# Patient Record
Sex: Female | Born: 1985 | Race: Black or African American | Hispanic: No | Marital: Single | Smoking: Never smoker
Health system: Southern US, Community
[De-identification: ages and names within clinical notes are randomized; demographics above are authoritative.]

## PROBLEM LIST (undated history)

## (undated) DIAGNOSIS — T7840XA Allergy, unspecified, initial encounter: Secondary | ICD-10-CM

## (undated) DIAGNOSIS — D649 Anemia, unspecified: Secondary | ICD-10-CM

## (undated) HISTORY — DX: Allergy, unspecified, initial encounter: T78.40XA

## (undated) HISTORY — DX: Anemia, unspecified: D64.9

---

## 2012-10-21 ENCOUNTER — Encounter (HOSPITAL_COMMUNITY): Payer: Self-pay | Admitting: Emergency Medicine

## 2012-10-21 ENCOUNTER — Emergency Department (HOSPITAL_COMMUNITY): Payer: Self-pay

## 2012-10-21 ENCOUNTER — Emergency Department (HOSPITAL_COMMUNITY)
Admission: EM | Admit: 2012-10-21 | Discharge: 2012-10-21 | Disposition: A | Discharge status: Home / Self Care | Payer: Self-pay | Attending: Emergency Medicine | Admitting: Emergency Medicine

## 2012-10-21 DIAGNOSIS — M7918 Myalgia, other site: Secondary | ICD-10-CM

## 2012-10-21 DIAGNOSIS — IMO0001 Reserved for inherently not codable concepts without codable children: Secondary | ICD-10-CM | POA: Insufficient documentation

## 2012-10-21 DIAGNOSIS — Z79899 Other long term (current) drug therapy: Secondary | ICD-10-CM | POA: Insufficient documentation

## 2012-10-21 MED ORDER — CYCLOBENZAPRINE HCL 10 MG PO TABS: 10.0000 mg | ORAL_TABLET | Freq: Two times a day (BID) | ORAL | Status: DC | PRN

## 2012-10-21 MED ORDER — KETOROLAC TROMETHAMINE 60 MG/2ML IM SOLN
60.0000 mg | Freq: Once | INTRAMUSCULAR | Status: DC
  Filled 2012-10-21: qty 2

## 2012-10-21 MED ORDER — IBUPROFEN 800 MG PO TABS: 800.0000 mg | ORAL_TABLET | Freq: Three times a day (TID) | ORAL | Status: DC

## 2012-10-21 MED ORDER — DIAZEPAM 5 MG PO TABS
5.0000 mg | ORAL_TABLET | Freq: Once | ORAL | Status: AC
  Administered 2012-10-21: 5 mg via ORAL
  Filled 2012-10-21: qty 1

## 2012-10-21 NOTE — ED Provider Notes (Signed)
History     CSN: [id number]  Arrival date & time 10/21/12  2125   First MD Initiated Contact with Patient 10/21/12 2157      Chief Complaint  Patient presents with  . Back Pain    R sided rib pain    (Consider location/radiation/quality/duration/timing/severity/associated sxs/prior treatment) HPI Comments: Patient is a 27 year old female who presents with sudden onset of middle back pain that started earlier this evening. The patient believes the pain is coming from a knot on her back. The pain is sharp and severe and does not radiate. The pain is constant. Movement and inspiration makes the pain worse. Nothing makes the pain better. Patient has not tried anything for pain. No associated symptoms. No saddles paresthesias or bladder/bowel incontinence.      History reviewed. No pertinent past medical history.  History reviewed. No pertinent past surgical history.  No family history on file.  History  Substance Use Topics  . Smoking status: Never Smoker   . Smokeless tobacco: Not on file  . Alcohol Use: Yes    OB History   Grav Para Term Preterm Abortions TAB SAB Ect Mult Living                  Review of Systems  Musculoskeletal: Positive for back pain.  All other systems reviewed and are negative.    Allergies  Review of patient's allergies indicates no known allergies.  Home Medications   Current Outpatient Rx  Name  Route  Sig  Dispense  Refill  . DM-APAP-CPM (CORICIDIN HBP FLU PO)   Oral   Take 2 tablets by mouth every 6 (six) hours as needed. For flu symptoms         . ibuprofen (ADVIL,MOTRIN) 200 MG tablet   Oral   Take 400 mg by mouth daily as needed for pain. For pain           BP 115/70  Pulse 83  Temp(Src) 98.1 F (36.7 C) (Oral)  Resp 16  SpO2 100%  LMP 10/18/2012  Physical Exam  Nursing note and vitals reviewed. Constitutional: She is oriented to person, place, and time. She appears well-developed and well-nourished. No distress.   HENT:  Head: Normocephalic and atraumatic.  Eyes: Conjunctivae are normal.  Neck: Normal range of motion. Neck supple.  Cardiovascular: Normal rate and regular rhythm.  Exam reveals no gallop and no friction rub.   No murmur heard. Pulmonary/Chest: Effort normal and breath sounds normal. She has no wheezes. She has no rales. She exhibits no tenderness.  Abdominal: Soft. There is no tenderness.  Musculoskeletal: Normal range of motion.  No right mid-back tenderness to palpation. No bruising or obvious deformity.   Neurological: She is alert and oriented to person, place, and time.  Speech is goal-oriented. Moves limbs without ataxia.   Skin: Skin is warm and dry.  Psychiatric: She has a normal mood and affect. Her behavior is normal.    ED Course  Procedures (including critical care time)  Labs Reviewed - No data to display Dg Chest 2 View  10/21/2012  *RADIOLOGY REPORT*  Clinical Data: Lambert Mody right side rib pain with inspiration, back pain, recent flu  CHEST - 2 VIEW  Comparison: None  Findings: Normal heart size, mediastinal contours, and pulmonary vascularity. Lungs grossly clear. No pleural effusion or pneumothorax. Bones unremarkable.  IMPRESSION: No acute abnormalities.   Original Report Authenticated By: Ulyses Southward, M.D.      1. Musculoskeletal pain  MDM  11:04 PM Patient reports relief after valium. Patient is PERC negative. Patient denies fever an urinary symptoms. Likely having a musculoskeletal pain. Patient will be discharged with pain medication and instructions to apply heat. Patient instructed to return with worsening or concerning symptoms.         Emilia Beck, PA-C 10/28/12 0246

## 2012-10-21 NOTE — ED Notes (Signed)
Pt states it felt like a knot on the center of her back this evening.  States she did stretches to help with knot and then 30 min later started having pain to R ribs with deep inspiration.

## 2012-10-28 NOTE — ED Provider Notes (Signed)
Medical screening examination/treatment/procedure(s) were performed by non-physician practitioner and as supervising physician I was immediately available for consultation/collaboration.  Corderius Saraceni, MD 10/28/12 0754 

## 2013-02-06 ENCOUNTER — Ambulatory Visit: Payer: BC Managed Care – PPO | Admitting: Internal Medicine

## 2014-01-15 IMAGING — CR DG CHEST 2V
2 series · 2 of 2 positions shown · non-contrast
Comparison: None

CLINICAL DATA: Sharp right side rib pain with inspiration, back
pain, recent flu

CHEST - 2 VIEW

[w chest pa]
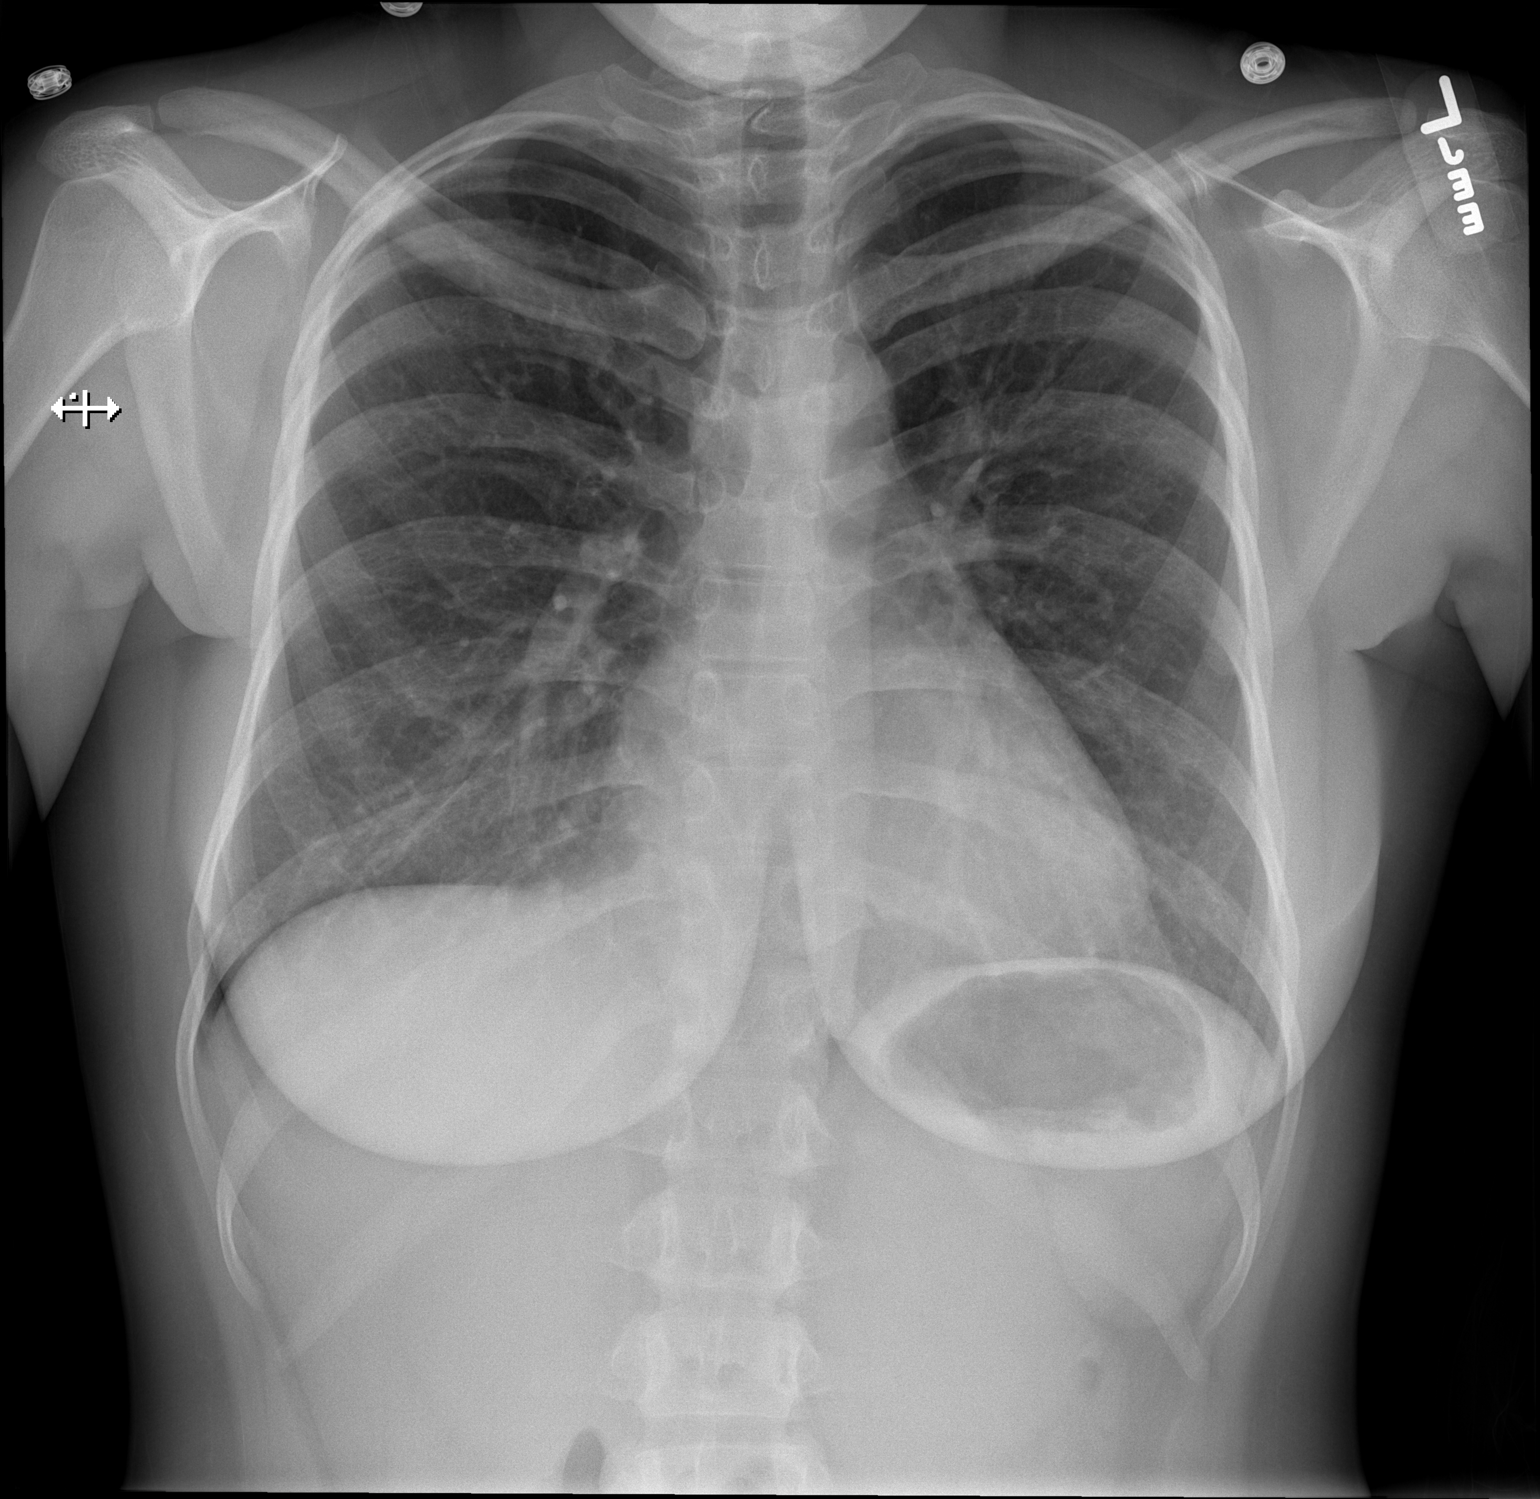

[w chest lat]
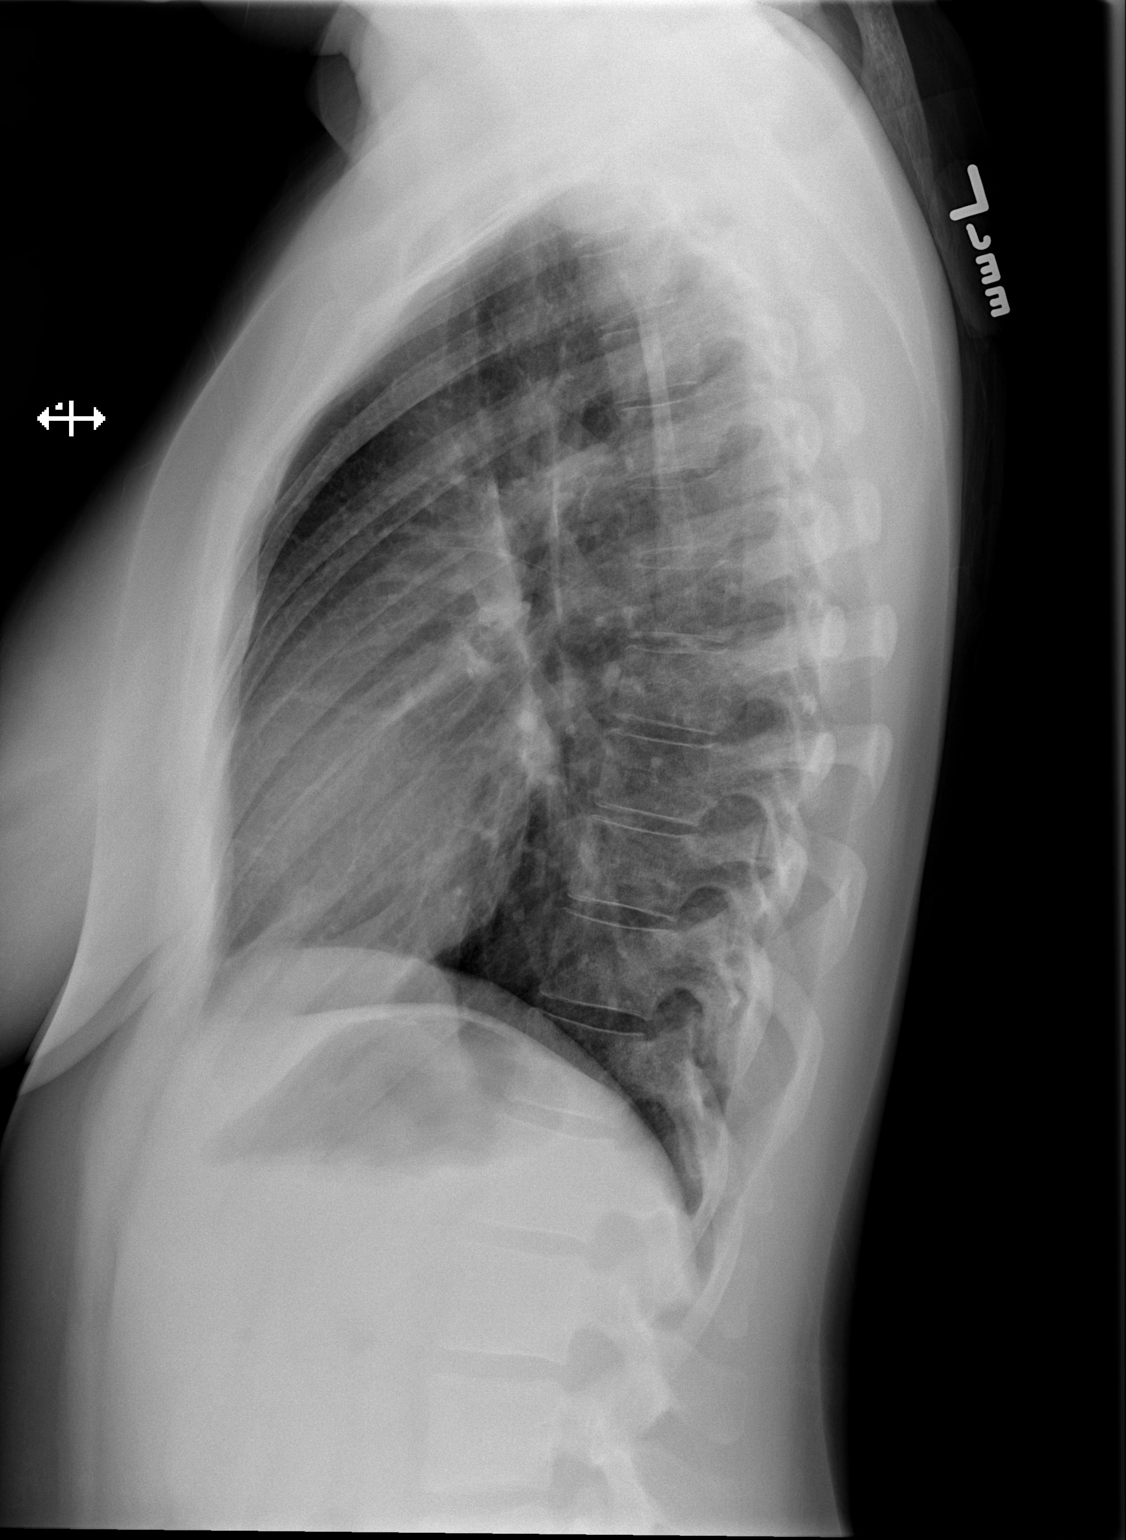

[2 of 2 positions shown; findings below may reference images not displayed]

FINDINGS: Normal heart size, mediastinal contours, and pulmonary vascularity.
Lungs grossly clear.
No pleural effusion or pneumothorax.
Bones unremarkable.
IMPRESSION: No acute abnormalities.

## 2014-11-24 ENCOUNTER — Emergency Department (HOSPITAL_COMMUNITY)
Admission: EM | Admit: 2014-11-24 | Discharge: 2014-11-24 | Disposition: A | Discharge status: Home / Self Care | Payer: 59 | Attending: Emergency Medicine | Admitting: Emergency Medicine

## 2014-11-24 ENCOUNTER — Encounter (HOSPITAL_COMMUNITY): Payer: Self-pay | Admitting: Emergency Medicine

## 2014-11-24 DIAGNOSIS — Z791 Long term (current) use of non-steroidal anti-inflammatories (NSAID): Secondary | ICD-10-CM | POA: Insufficient documentation

## 2014-11-24 DIAGNOSIS — L299 Pruritus, unspecified: Secondary | ICD-10-CM | POA: Insufficient documentation

## 2014-11-24 DIAGNOSIS — R202 Paresthesia of skin: Secondary | ICD-10-CM | POA: Diagnosis present

## 2014-11-24 LAB — BASIC METABOLIC PANEL
ANION GAP: 9 (ref 5–15)
BUN: 14 mg/dL (ref 6–23)
CALCIUM: 9.2 mg/dL (ref 8.4–10.5)
CO2: 24 mmol/L (ref 19–32)
CREATININE: 0.83 mg/dL (ref 0.50–1.10)
Chloride: 104 mmol/L (ref 96–112)
GFR calc non Af Amer: >90 mL/min (ref >90)
Glucose, Bld: 93 mg/dL (ref 70–99)
Potassium: 4.1 mmol/L (ref 3.5–5.1)
Sodium: 137 mmol/L (ref 135–145)

## 2014-11-24 LAB — CBC WITH DIFFERENTIAL/PLATELET
BASOS ABS: 0.1 10*3/uL (ref 0.0–0.1)
Basophils Relative: 1 % (ref 0–1)
Eosinophils Absolute: 0.4 10*3/uL (ref 0.0–0.7)
Eosinophils Relative: 4 % (ref 0–5)
HCT: 39.2 % (ref 36.0–46.0)
Hemoglobin: 12.6 g/dL (ref 12.0–15.0)
LYMPHS ABS: 1.7 10*3/uL (ref 0.7–4.0)
LYMPHS PCT: 17 % (ref 12–46)
MCH: 26.7 pg (ref 26.0–34.0)
MCHC: 32.1 g/dL (ref 30.0–36.0)
MCV: 83.1 fL (ref 78.0–100.0)
Monocytes Absolute: 0.6 10*3/uL (ref 0.1–1.0)
Monocytes Relative: 6 % (ref 3–12)
NEUTROS ABS: 7.1 10*3/uL (ref 1.7–7.7)
NEUTROS PCT: 72 % (ref 43–77)
PLATELETS: 282 10*3/uL (ref 150–400)
RBC: 4.72 MIL/uL (ref 3.87–5.11)
RDW: 12.8 % (ref 11.5–15.5)
WBC: 9.9 10*3/uL (ref 4.0–10.5)

## 2014-11-24 MED ORDER — HYDROXYZINE HCL 25 MG PO TABS: 25.0000 mg | ORAL_TABLET | Freq: Three times a day (TID) | ORAL | Status: DC | PRN

## 2014-11-24 NOTE — ED Provider Notes (Signed)
CSN: [id number]     Arrival date & time 11/24/14  1519 History  This chart was scribed for non-physician practitioner, Fayrene Helper, PA-C,working with Azalia Bilis, MD, by Karle Plumber, ED Scribe. This patient was seen in room WTR6/WTR6 and the patient's care was started at 3:43 PM.  Chief Complaint  Patient presents with  . tingling all over    The history is provided by the patient and medical records. No language interpreter was used.    HPI Comments:  Wanda Hendrix is a 29 y.o. female with PMHx of anemia who presents to the Emergency Department complaining of a crawling, itching feeling throughout her body for the past 7 days. She reports she feels intermittent "zaps" she describes as a bug biting her. She states she has never had symptoms like this in the past and reports it is difficult for her to concentrate. She reports increase in stress level for the past few months stating she is taking the [employer] in the next week. She denies any new soaps, creams, detergents or any other personal hygiene products. She denies any modifying factors of her symptoms or contact with anyone with similar symptoms. She reports her LMP was one week ago, lasted 5 days, and was normal. She denies taking any herbal supplements and reports the only medications she takes are iron supplements. Denies any illicit drug use. Denies difficulty breathing, difficulty swallowing, fever, chills, light-headedness, dizziness, abdominal cramping, nausea or vomiting.  History reviewed. No pertinent past medical history. History reviewed. No pertinent past surgical history. No family history on file. History  Substance Use Topics  . Smoking status: Never Smoker   . Smokeless tobacco: Not on file  . Alcohol Use: Yes   OB History    No data available     Review of Systems  Constitutional: Negative for fever and chills.  HENT: Negative for trouble swallowing.   Respiratory: Negative for shortness of breath.    Gastrointestinal: Negative for nausea, vomiting and abdominal pain.  Skin: Negative for rash.       Itching  Neurological: Negative for dizziness and light-headedness.    Allergies  Review of patient's allergies indicates no known allergies.  Home Medications   Prior to Admission medications   Medication Sig Start Date End Date Taking? Authorizing Provider  cyclobenzaprine (FLEXERIL) 10 MG tablet Take 1 tablet (10 mg total) by mouth 2 (two) times daily as needed for muscle spasms. 10/21/12   Kaitlyn Szekalski, PA-C  DM-APAP-CPM (CORICIDIN HBP FLU PO) Take 2 tablets by mouth every 6 (six) hours as needed. For flu symptoms    Historical Provider, MD  ibuprofen (ADVIL,MOTRIN) 200 MG tablet Take 400 mg by mouth daily as needed for pain. For pain    Historical Provider, MD  ibuprofen (ADVIL,MOTRIN) 800 MG tablet Take 1 tablet (800 mg total) by mouth 3 (three) times daily. 10/21/12   Emilia Beck, PA-C   Triage Vitals: BP 116/76 mmHg  Pulse 91  Resp 14  SpO2 99%  LMP 11/17/2014 Physical Exam  Constitutional: She is oriented to person, place, and time. She appears well-developed and well-nourished.  HENT:  Head: Normocephalic and atraumatic.  Mouth/Throat: Uvula is midline, oropharynx is clear and moist and mucous membranes are normal.  Eyes: EOM are normal.  Mildly pale conjunctiva.  Neck: Normal range of motion.  Cardiovascular: Normal rate, regular rhythm and normal heart sounds.  Exam reveals no gallop and no friction rub.   No murmur heard. Pulmonary/Chest: Effort normal and breath sounds  normal. No respiratory distress. She has no wheezes. She has no rales.  Abdominal: Soft. There is no tenderness.  Musculoskeletal: Normal range of motion.  Neurological: She is alert and oriented to person, place, and time.  Skin: Skin is warm and dry. No rash noted.  Psychiatric: She has a normal mood and affect. Her behavior is normal.  Nursing note and vitals reviewed.   ED Course   Procedures (including critical care time) DIAGNOSTIC STUDIES: Oxygen Saturation is 99% on RA, normal by my interpretation.   COORDINATION OF CARE: 3:51 PM- Will check labs. Pt verbalizes understanding and agrees to plan.  4:12 PM Pt with generalized pruritus without specific cause.  Stress is a possible contributor.  Will check basic labs to r/o electrolytes abnormalities or other cause such as polycythemia vera or iron deficiency pruritus. Vital sign otherwise stable.    5:59 PM No evidence of iron deficiency anemia or polycythemia vera.  Normal electrolytes panel.  Stable for discharge.  reeturn precaution discussed.   Medications - No data to display  Labs Review Labs Reviewed  CBC WITH DIFFERENTIAL/PLATELET  BASIC METABOLIC PANEL    Imaging Review No results found.   EKG Interpretation None      MDM   Final diagnoses:  Pruritus and related conditions    BP 116/76 mmHg  Pulse 91  Temp(Src) 98.4 F (36.9 C) (Oral)  Resp 14  SpO2 99%  LMP 11/17/2014   I personally performed the services described in this documentation, which was scribed in my presence. The recorded information has been reviewed and is accurate.    Fayrene HelperBowie Devany Aja, PA-C 11/24/14 1836  Azalia BilisKevin Campos, MD 11/24/14 [phone]

## 2014-11-24 NOTE — ED Notes (Signed)
Per pt, states she has tingling all over-unable to concentrate-states it feels like a when a bug bites your skin

## 2014-11-24 NOTE — Discharge Instructions (Signed)
Pruritus  °Pruritus is an itch. There are many different problems that can cause an itch. Dry skin is one of the most common causes of itching. Most cases of itching do not require medical attention.  °HOME CARE INSTRUCTIONS  °Make sure your skin is moistened on a regular basis. A moisturizer that contains petroleum jelly is best for keeping moisture in your skin. If you develop a rash, you may try the following for relief:  °· Use corticosteroid cream. °· Apply cool compresses to the affected areas. °· Bathe with Epsom salts or baking soda in the bathwater. °· Soak in colloidal oatmeal baths. These are available at your pharmacy. °· Apply baking soda paste to the rash. Stir water into baking soda until it reaches a paste-like consistency. °· Use an anti-itch lotion. °· Take over-the-counter diphenhydramine medicine by mouth as the instructions direct. °· Avoid scratching. Scratching may cause the rash to become infected. If itching is very bad, your caregiver may suggest prescription lotions or creams to lessen your symptoms. °· Avoid hot showers, which can make itching worse. A cold shower may help with itching as long as you use a moisturizer after the shower. °SEEK MEDICAL CARE IF: °The itching does not go away after several days. °Document Released: 03/25/2011 Document Revised: 11/27/2013 Document Reviewed: 03/25/2011 °[employer]® Patient Information ©2015 [employer]. This information is not intended to replace advice given to you by your health care provider. Make sure you discuss any questions you have with your health care provider. ° °

## 2014-12-21 ENCOUNTER — Encounter: Payer: Self-pay | Admitting: Internal Medicine

## 2015-02-13 ENCOUNTER — Telehealth: Payer: Self-pay | Admitting: Internal Medicine

## 2015-02-13 ENCOUNTER — Ambulatory Visit: Payer: 59 | Admitting: Internal Medicine

## 2015-02-13 NOTE — Telephone Encounter (Signed)
Dr Rhea BeltonPyrtle, Do you want to charge??

## 2015-02-13 NOTE — Telephone Encounter (Signed)
No charge. 

## 2015-04-18 ENCOUNTER — Encounter: Payer: Self-pay | Admitting: Urgent Care

## 2015-04-18 ENCOUNTER — Ambulatory Visit (INDEPENDENT_AMBULATORY_CARE_PROVIDER_SITE_OTHER): Payer: 59 | Admitting: Urgent Care

## 2015-04-18 VITALS — BP 118/60 | HR 67 | Temp 98.4°F | Resp 16 | Ht 66.5 in | Wt 155.0 lb

## 2015-04-18 DIAGNOSIS — Z0184 Encounter for antibody response examination: Secondary | ICD-10-CM | POA: Diagnosis not present

## 2015-04-18 DIAGNOSIS — Z7189 Other specified counseling: Secondary | ICD-10-CM

## 2015-04-18 DIAGNOSIS — Z23 Encounter for immunization: Secondary | ICD-10-CM | POA: Diagnosis not present

## 2015-04-18 DIAGNOSIS — Z7185 Encounter for immunization safety counseling: Secondary | ICD-10-CM

## 2015-04-18 NOTE — Progress Notes (Signed)
    MRN: [id number] DOB: 1985/12/18  Subjective:   Wanda Hendrix is a 29 y.o. 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 She is presenting today for immunization counseling. The [employer] shows she has had 3 previous hepatitis B doses. Her school is also requiring that she get hepatitis A vaccination. The CDC recommends that she also have typhoid vaccination but patient declines this today. Denies any other aggravating or relieving factors, no other questions or concerns.  Wanda Hendrix is not currently taking medications. Also has No Known Allergies.  Wanda Hendrix  has no past medical history on file. Also  has no past surgical history on file.  Objective:   Vitals: BP 118/60 mmHg  Pulse 67  Temp(Src) 98.4 F (36.9 C) (Oral)  Resp 16  Ht 5' 6.5" (1.689 m)  Wt 155 lb (70.308 kg)  BMI 24.65 kg/m2  LMP 04/09/2015  Physical Exam  Constitutional: She is oriented to person, place, and time. She appears well-developed and well-nourished.  Cardiovascular: Normal rate.   Pulmonary/Chest: Effort normal.  Neurological: She is alert and oriented to person, place, and time.  Psychiatric: She has a normal mood and affect.   Assessment and Plan :   1. Encounter for counseling regarding immunization - Hepatitis B surface antibody pending. - Okay to call and leave VM for patient with results.  2. Need for prophylactic vaccination and inoculation against viral hepatitis - Hepatitis A vaccine adult IM  3. Need for Tdap vaccination - Tdap vaccine greater than or equal to 7yo IM   Wallis Bamberg, PA-C [facility] [phone] 04/18/2015 1:01 PM

## 2015-04-18 NOTE — Patient Instructions (Addendum)
Hepatitis A Vaccine, Inactivated suspension for injection What is this medicine? HEPATITIS A VACCINE (hep uh TAHY tis A VAK seen) is a vaccine to protect from an infection with the hepatitis A virus. This vaccine does not contain the live virus. It will not cause a hepatitis infection. This vaccine is also used with immunoglobulin to prevent infection in people who have been exposed to hepatitis A. This medicine may be used for other purposes; ask your health care provider or pharmacist if you have questions. COMMON BRAND NAME(S): Havrix, Vaqta What should I tell my health care provider before I take this medicine? They need to know if you have any of these conditions: -bleeding disorder -fever or infection -heart disease -immune system problems -an unusual or allergic reaction to hepatitis A vaccine, latex, neomycin, other medicines, foods, dyes, or preservatives -pregnant or trying to get pregnant -breast-feeding How should I use this medicine? This vaccine is for injection into a muscle. It is given by a health care professional. A copy of Vaccine Information Statements will be given before each vaccination. Read this sheet carefully each time. The sheet may change frequently. Talk to your pediatrician regarding the use of this medicine in children. While this drug may be prescribed for children as young as 52 months of age for selected conditions, precautions do apply. Overdosage: If you think you have taken too much of this medicine contact a poison control center or emergency room at once. NOTE: This medicine is only for you. Do not share this medicine with others. What if I miss a dose? This does not apply. What may interact with this medicine? -medicines to treat cancer -medicines that suppress your immune function like adalimumab, anakinra, etanercept, infliximab -steroid medicines like prednisone or cortisone This list may not describe all possible interactions. Give your health  care provider a list of all the medicines, herbs, non-prescription drugs, or dietary supplements you use. Also tell them if you smoke, drink alcohol, or use illegal drugs. Some items may interact with your medicine. What should I watch for while using this medicine? See your health care provider for a booster shot of this vaccine as directed. Tell your doctor right away if you have any serious or unusual side effects after getting this vaccine. You will not have protection from the hepatitis A virus for at least 8 to 10 days after your first injection. The length of time you will have protection from hepatitis A virus infection is not known. Check with your doctor if you have questions about your immunity. See your doctor before you travel out of the country. What side effects may I notice from receiving this medicine? Side effects that you should report to your doctor or health care professional as soon as possible: -allergic reactions like skin rash, itching or hives, swelling of the face, lips, or tongue -breathing problems -seizures -yellowing of the eyes or skin Side effects that usually do not require medical attention (report to your doctor or health care professional if they continue or are bothersome): -diarrhea -fever -loss of appetite -muscle pain -nausea -pain, redness, swelling or irritation at site where injected -tiredness This list may not describe all possible side effects. Call your doctor for medical advice about side effects. You may report side effects to FDA at 1-800-FDA-1088. Where should I keep my medicine? This drug is given in a hospital or clinic and will not be stored at home. NOTE: This sheet is a summary. It may not cover all possible  information. If you have questions about this medicine, talk to your doctor, pharmacist, or health care provider.  2015, Elsevier/Gold Standard. (2013-11-13 13:19:40)    Tdap Vaccine (Tetanus, Diphtheria, Pertussis): What You  Need to Know 1. Why get vaccinated? Tetanus, diphtheria and pertussis can be very serious diseases, even for adolescents and adults. Tdap vaccine can protect [place] from these diseases. TETANUS (Lockjaw) causes painful muscle tightening and stiffness, usually all over the body.  It can lead to tightening of muscles in the head and neck so you can't open your mouth, swallow, or sometimes even breathe. Tetanus kills about 1 out of 5 people who are infected. DIPHTHERIA can cause a thick coating to form in the back of the throat.  It can lead to breathing problems, paralysis, heart failure, and death. PERTUSSIS (Whooping Cough) causes severe coughing spells, which can cause difficulty breathing, vomiting and disturbed sleep.  It can also lead to weight loss, incontinence, and rib fractures. Up to 2 in 100 adolescents and 5 in 100 adults with pertussis are hospitalized or have complications, which could include pneumonia or death. These diseases are caused by bacteria. Diphtheria and pertussis are spread from person to person through coughing or sneezing. Tetanus enters the body through cuts, scratches, or wounds. Before vaccines, the [place] saw as many as 200,000 cases a year of diphtheria and pertussis, and hundreds of cases of tetanus. Since vaccination began, tetanus and diphtheria have dropped by about 99% and pertussis by about 80%. 2. Tdap vaccine Tdap vaccine can protect adolescents and adults from tetanus, diphtheria, and pertussis. One dose of Tdap is routinely given at age 61 or 27. People who did not get Tdap at that age should get it as soon as possible. Tdap is especially important for health care professionals and anyone having close contact with a baby younger than 12 months. Pregnant women should get a dose of Tdap during every pregnancy, to protect the newborn from pertussis. Infants are most at risk for severe, life-threatening complications from pertussis. A similar vaccine,  called Td, protects from tetanus and diphtheria, but not pertussis. A Td booster should be given every 10 years. Tdap may be given as one of these boosters if you have not already gotten a dose. Tdap may also be given after a severe cut or burn to prevent tetanus infection. Your doctor can give you more information. Tdap may safely be given at the same time as other vaccines. 3. Some people should not get this vaccine  If you ever had a life-threatening allergic reaction after a dose of any tetanus, diphtheria, or pertussis containing vaccine, OR if you have a severe allergy to any part of this vaccine, you should not get Tdap. Tell your doctor if you have any severe allergies.  If you had a coma, or long or multiple seizures within 7 days after a childhood dose of DTP or DTaP, you should not get Tdap, unless a cause other than the vaccine was found. You can still get Td.  Talk to your doctor if you:  have epilepsy or another nervous system problem,  had severe pain or swelling after any vaccine containing diphtheria, tetanus or pertussis,  ever had Guillain-Barr Syndrome (GBS),  aren't feeling well on the day the shot is scheduled. 4. Risks of a vaccine reaction With any medicine, including vaccines, there is a chance of side effects. These are usually mild and go away on their own, but serious reactions are also possible. Brief fainting spells  can follow a vaccination, leading to injuries from falling. Sitting or lying down for about 15 minutes can help prevent these. Tell your doctor if you feel dizzy or light-headed, or have vision changes or ringing in the ears. Mild problems following Tdap (Did not interfere with activities)  Pain where the shot was given (about 3 in 4 adolescents or 2 in 3 adults)  Redness or swelling where the shot was given (about 1 person in 5)  Mild fever of at least 100.70F (up to about 1 in 25 adolescents or 1 in 100 adults)  Headache (about 3 or 4 people  in 10)  Tiredness (about 1 person in 3 or 4)  Nausea, vomiting, diarrhea, stomach ache (up to 1 in 4 adolescents or 1 in 10 adults)  Chills, body aches, sore joints, rash, swollen glands (uncommon) Moderate problems following Tdap (Interfered with activities, but did not require medical attention)  Pain where the shot was given (about 1 in 5 adolescents or 1 in 100 adults)  Redness or swelling where the shot was given (up to about 1 in 16 adolescents or 1 in 25 adults)  Fever over 102F (about 1 in 100 adolescents or 1 in 250 adults)  Headache (about 3 in 20 adolescents or 1 in 10 adults)  Nausea, vomiting, diarrhea, stomach ache (up to 1 or 3 people in 100)  Swelling of the entire arm where the shot was given (up to about 3 in 100). Severe problems following Tdap (Unable to perform usual activities; required medical attention)  Swelling, severe pain, bleeding and redness in the arm where the shot was given (rare). A severe allergic reaction could occur after any vaccine (estimated less than 1 in a million doses). 5. What if there is a serious reaction? What should I look for?  Look for anything that concerns you, such as signs of a severe allergic reaction, very high fever, or behavior changes. Signs of a severe allergic reaction can include hives, swelling of the face and throat, difficulty breathing, a fast heartbeat, dizziness, and weakness. These would start a few minutes to a few hours after the vaccination. What should I do?  If you think it is a severe allergic reaction or other emergency that can't wait, call 9-1-1 or get the person to the nearest hospital. Otherwise, call your doctor.  Afterward, the reaction should be reported to the "Vaccine Adverse Event Reporting System" (VAERS). Your doctor might file this report, or you can do it yourself through the VAERS web site at www.vaers.LAgents.no, or by calling 1-(873)746-8400. VAERS is only for reporting reactions. They do  not give medical advice.  6. The National Vaccine Injury Compensation Program The [employer] Vaccine Injury Compensation Program (VICP) is a federal program that was created to compensate people who may have been injured by certain vaccines. Persons who believe they may have been injured by a vaccine can learn about the program and about filing a claim by calling 1-(878) 452-8604 or visiting the VICP website at SpiritualWord.at. 7. How can I learn more?  Ask your doctor.  Call your local or state health department.  Contact the Centers for Disease Control and Prevention (CDC):  Call 667-470-3606 or visit CDC's website at PicCapture.uy. CDC Tdap Vaccine VIS (12/03/11) Document Released: 01/12/2012 Document Revised: 11/27/2013 Document Reviewed: 10/25/2013 ExitCare Patient Information 2015 [place], [place]. This information is not intended to replace advice given to you by your health care provider. Make sure you discuss any questions you have with your health  care provider.  

## 2015-04-19 LAB — HEPATITIS B SURFACE ANTIBODY, QUANTITATIVE: HEPATITIS B-POST: 1.1 m[IU]/mL

## 2015-05-06 ENCOUNTER — Ambulatory Visit (INDEPENDENT_AMBULATORY_CARE_PROVIDER_SITE_OTHER): Payer: 59 | Admitting: Urgent Care

## 2015-05-06 VITALS — BP 110/60 | HR 84 | Temp 98.5°F | Resp 18 | Ht 66.0 in | Wt 156.0 lb

## 2015-05-06 DIAGNOSIS — Z23 Encounter for immunization: Secondary | ICD-10-CM | POA: Diagnosis not present

## 2015-05-06 NOTE — Progress Notes (Signed)
Patient is returning to clinic for Hep B series. Advised our CMA Latoria that she is okay to receive her second dose. Plan is to have patient rtc for immunizations only in 4 months.  Wallis Bamberg, PA-C [facility] [phone] 05/06/2015  11:38 AM

## 2015-05-06 NOTE — Patient Instructions (Signed)
Hepatitis B Vaccine: What You Need to Know 1. Why get vaccinated? Hepatitis B is a serious disease that affects the liver. It is caused by the hepatitis B virus. Hepatitis B can cause mild illness lasting a few weeks, or it can lead to a serious, lifelong illness. Hepatitis B virus infection can be either acute or chronic. Acute hepatitis B virus infection is a short-term illness that occurs within the first 6 months after someone is exposed to the hepatitis B virus. This can lead to:  fever, fatigue, loss of appetite, nausea, and/or vomiting  jaundice (yellow skin or eyes, dark urine, clay-colored bowel movements)  pain in muscles, joints, and stomach Chronic hepatitis B virus infection is a long-term illness that occurs when the hepatitis B virus remains in a person's body. Most people who go on to develop chronic hepatitis B do not have symptoms, but it is still very serious and can lead to:  liver damage (cirrhosis)  liver cancer  death Chronically-infected people can spread hepatitis B virus to others, even if they do not feel or look sick themselves. Up to 1.4 million people in the [place] may have chronic hepatitis B infection. About 90% of infants who get hepatitis B become chronically infected and about 1 out of 4 of them dies. Hepatitis B is spread when blood, semen, or other body fluid infected with the Hepatitis B virus enters the body of a person who is not infected. People can become infected with the virus through:  Birth (a baby whose mother is infected can be infected at or after birth)  Sharing items such as razors or toothbrushes with an infected person  Contact with the blood or open sores of an infected person  Sex with an infected partner  Sharing needles, syringes, or other drug-injection equipment  Exposure to blood from needlesticks or other sharp instruments Each year about 2,000 people in the [place] die from hepatitis B-related liver  disease. Hepatitis B vaccine can prevent hepatitis B and its consequences, including liver cancer and cirrhosis. 2. Hepatitis B vaccine Hepatitis B vaccine is made from parts of the hepatitis B virus. It cannot cause hepatitis B infection. The vaccine is usually given as 3 or 4 shots over a 6-month period. Infants should get their first dose of hepatitis B vaccine at birth and will usually complete the series at 6 months of age. All children and adolescents younger than 19 years of age who have not yet gotten the vaccine should also be vaccinated. Hepatitis B vaccine is recommended for unvaccinated adults who are at risk for hepatitis B virus infection, including:  People whose sex partners have hepatitis B  Sexually active persons who are not in a long-term monogamous relationship  Persons seeking evaluation or treatment for a sexually transmitted disease  Men who have sexual contact with other men  People who share needles, syringes, or other drug-injection equipment  People who have household contact with someone infected with the hepatitis B virus  Health care and public safety workers at risk for exposure to blood or body fluids  Residents and staff of facilities for developmentally disabled persons  Persons in correctional facilities  Victims of sexual assault or abuse  Travelers to regions with increased rates of hepatitis B  People with chronic liver disease, kidney disease, HIV infection, or diabetes  Anyone who wants to be protected from hepatitis B There are no known risks to getting hepatitis B vaccine at the same time as other   vaccines. 3. Some people should not get this vaccine Tell the person who is giving the vaccine:  If the person getting the vaccine has any severe, life-threatening allergies. If you ever had a life-threatening allergic reaction after a dose of hepatitis B vaccine, or have a severe allergy to any part of this vaccine, you may be advised not to  get vaccinated. Ask your health care provider if you want information about vaccine components.  If the person getting the vaccine is not feeling well. If you have a mild illness, such as a cold, you can probably get the vaccine today. If you are moderately or severely ill, you should probably wait until you recover. Your doctor can advise you. 4. Risks of a vaccine reaction With any medicine, including vaccines, there is a chance of side effects. These are usually mild and go away on their own, but serious reactions are also possible. Most people who get hepatitis B vaccine do not have any problems with it. Minor problems following hepatitis B vaccine include:  soreness where the shot was given  temperature of 99.9F or higher If these problems occur, they usually begin soon after the shot and last 1 or 2 days. Your doctor can tell you more about these reactions. Other problems that could happen after this vaccine:  People sometimes faint after a medical procedure, including vaccination. Sitting or lying down for about 15 minutes can help prevent fainting and injuries caused by a fall. Tell your provider if you feel dizzy, or have vision changes or ringing in the ears.  Some people get shoulder pain that can be more severe and longer-lasting than the more routine soreness that can follow injections. This happens very rarely.  Any medication can cause a severe allergic reaction. Such reactions from a vaccine are very rare, estimated at about 1 in a million doses, and would happen within a few minutes to a few hours after the vaccination. As with any medicine, there is a very remote chance of a vaccine causing a serious injury or death. The safety of vaccines is always being monitored. For more information, visit: www.cdc.gov/vaccinesafety/ 5. What if there is a serious problem? What should I look for?  Look for anything that concerns you, such as signs of a severe allergic reaction, very  high fever, or unusual behavior. Signs of a severe allergic reaction can include hives, swelling of the face and throat, difficulty breathing, a fast heartbeat, dizziness, and weakness. These would start a few minutes to a few hours after the vaccination. What should I do?  If you think it is a severe allergic reaction or other emergency that can't wait, call 9-1-1 or get to the nearest hospital. Otherwise, call your clinic. Afterward, the reaction should be reported to the Vaccine Adverse Event Reporting System (VAERS). Your doctor should file this report, or you can do it yourself through the VAERS web site at www.vaers.hhs.gov, or by calling 1-800-822-7967. VAERS does not give medical advice. 6. The National Vaccine Injury Compensation Program The National Vaccine Injury Compensation Program (VICP) is a federal program that was created to compensate people who may have been injured by certain vaccines. Persons who believe they may have been injured by a vaccine can learn about the program and about filing a claim by calling 1-800-338-2382 or visiting the VICP website at www.hrsa.gov/vaccinecompensation. There is a time limit to file a claim for compensation. 7. How can I learn more?  Ask your healthcare provider. He or she   can give you the vaccine package insert or suggest other sources of information.  Call your local or state health department.  Contact the Centers for Disease Control and Prevention (CDC):  Call 1-800-232-4636 (1-800-CDC-INFO) or  Visit CDC's website at www.cdc.gov/vaccines CDC Hepatitis B VIS (02/13/2015)   This information is not intended to replace advice given to you by your health care provider. Make sure you discuss any questions you have with your health care provider.   Document Released: 05/07/2006 Document Revised: 04/03/2015 Document Reviewed: 03/02/2015 Elsevier Interactive Patient Education 2016 [employer]  

## 2020-01-31 NOTE — Progress Notes
FAMILY MEDICINE OUTPATIENT PROGRESS NOTE     Patient: Lindsey Mcdowell  MRN: [id number]  Date of Service: 02/01/2020      Chief Complaint:     Chief Complaint   Patient presents with   ??? Establish Care   ??? Breast Mass       History of Present Illness:   Lindsey Mcdowell is a 34 y.o. female presenting newly to clinic to establish care and for evaluation of a breast lump  Patient has no significant past medical history    Prior PCP:     Current Specialists: none    New concern(s) today:  1. 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  [x]  No    [] Yes    Pain?    [x]  No    [] Yes      Nipple discharge?  [x]  No    [] Yes     FHx of breast cancer?  []  No    [x] Yes - paternal great aunt       Chronic conditions: none    Health Maintenance:  Is in medical school  Exercise; every other day  Vaccine status:  Prior Pap? 04/2019- Lindsey Mcdowell    Most Recent PHQ-2 Verbal Questionnaire:Negative (02/01/20 0802)  If PHQ-2/PHQ-9 Score is blank, then no PHQ-9 screen was performed.     Most Recent PHQ-2 Score:      Most Recent PHQ-9 Score:            PHQ-9 Score   Depression Severity     Proposed Treatment Actions    (reference)      0  -  4 None - minimal  ??? None     5  -  9  Mild  ??? Watchful waiting; repeat PHQ-9 at follow-up    10 - 14  Moderate  ??? Treatment plan, considering counseling, follow-up and/or pharmacotherapy    15 - 19  Moderately Severe  ??? Active treatment with pharmacotherapy and/or psychotherapy    20 - 27  Severe  ??? Immediate initiation of pharmacotherapy and, if severe impairment or poor response to therapy, expedited referral to a mental health specialist for psychotherapy and/or collaborative management           Medications:        Reviewed with patient   No current outpatient medications on file.     Allergies:        Reviewed with patient   No Known Allergies    Past Medical History:       Reviewed with patient     Past Medical History:   Diagnosis Date   ??? No pertinent past medical history        Surgical History:       Reviewed with patient     Past Surgical History:   Procedure Laterality Date   ??? NO PAST SURGERIES         Family History:       Reviewed with patient     Family History   Problem Relation Age of Onset   ??? Hypertension Mother 39   ??? Multiple myeloma Father 55   ??? Ovarian cancer Paternal Great-Aunt thru PGM        Social History:      Reviewed with patient  Social History     Tobacco Use   ??? Smoking status: Never Smoker   ??? Smokeless tobacco: Never Used   Substance Use Topics   ??? Alcohol use: Not Currently   ??? Drug use: Never     Social History     Social History Narrative   ??? Not on file          Review of Systems:     ROS was reviewed and negative except as noted in HPI       Vitals:     Vitals:    02/01/20 0803   BP: 95/60   Pulse: 65   Temp: 36.1 ???C (97 ???F)   TempSrc: Tympanic   Weight: 131 lb 6.4 oz (59.6 kg)   Height: 5' 5.5'' (1.664 m)      Body mass index is 21.53 kg/m???.      Physical Examination:     Gen: WN/WD, no acute distress  Head: NC/AT  LYMPHATIC: No axillary or supraclavicular lymphadenopathy present  LEFT BREAST:  No mass/lumps palpated. No nipple discharge. skin- no thickening/scarring, no lesions  RIGHT BREAST:  No mass/lumps palpated. No nipple discharge. skin- no thickening/scarring, no lesions  Ext: Warm, well perfused. No edema.   Neuro: CN intact grossly, Gait- normal  Skin: No obvious rashes, lesions noted.      REVIEW OF DATA   I have:  Reviewed/ordered []  1 []  2 []  ? 3 unique laboratory, radiology, and/or diagnostic tests noted below   Reviewed []  1 []  2 []  ? 3 prior external notes and incorporated into patient assessment   []  Discussed management or test interpretation with external provider(s) as noted     Laboratory Data:       Reviewed by me   No results found for: WBC, HGB, HCT, MCV, PLT    No results found for: NA, K, CL, CO2, BUN, CREAT, GLUCOSE, CALCIUM, PHOS, MG, ALT, AST, GGT, ALKPHOS, BILITOT, BILICON    No results found for: HGBA1C    No results found for: CHOL, CHOLHDL, CHOLDLQ, CHOLDLCAL, TRIGLY    No results found for: TSH                     Studies:         Reviewed by me         Preventive Health Results:         Reviewed by me          No results found.         ASSESSMENT & PLAN   Lindsey Mcdowell is a 34 y.o. female presents for   Chief Complaint   Patient presents with   ??? Establish Care   ??? Breast Mass     1. Breast lump  No evidence of significant lump or mass on exam lying down. When sitting forward, can feel her mammary glands/ducts more easily but no significant prominence. Advised patient to monitor and notify me if the area she is feeling changes size or is she has any pain or nipple discharge. Patient reassured.     2018 ACC/AHA guidelines recommends that patient is not in statin benefit group. Encourage adherence to heart-healthy lifestyle.    10-year ASCVD risk  cannot be calculated because at least one required variable is not available in [facility]  as of 8:21 AM on 02/01/2020      Return for anytime for CPE.       Signed: Salli Real, MD  Family Medicine  Clinical Instructor of Medicine, [place] Department of Medicine  Division of General Internal Medicine & Health Services Research  Primary & Specialty Care DTLA @ [facility]   [address]   [place] [address]   Phone: [phone]  Fax: [phone]

## 2020-02-01 ENCOUNTER — Ambulatory Visit: Payer: PRIVATE HEALTH INSURANCE

## 2020-02-01 DIAGNOSIS — N63 Unspecified lump in unspecified breast: Secondary | ICD-10-CM

## 2020-02-01 NOTE — Patient Instructions
A NOTE FROM DR. Dayton Scrape    Welcome to the [employer] - Primary & Specialty Care DTLA @ [facility]. Thank you for choosing our clinic for your care!    Contact us:  Phone: [phone]   Fax: [phone]  [email or web address]     ------------------------------------------------------------------------------------------------------------    ABOUT OUR PRACTICE    Advice for effective doctor visits:    1. Make a list  For primary care, write down your top priorities for the visit and bring the list with you. Usually 1-3 items is a good number. Setting realistic goals beforehand will help you leave satisfied. I will always address your top item, unless we identify an emergency that must be addressed first. If needed, we can plan a follow-up visit for any items that we do not have time to address in a single visit. Items may include:  ??? Going over your complete medical history and family history  ??? Discussing your overall health goals, preventative health (how to stay healthy and prevent disease or injury) and/or a general physical exam  ??? Talking about a new symptom  ??? Reviewing your progress with a previous symptom or medical condition  ??? Having a form filled out  ??? Asking a general medical question  ??? Discussing your options for a medical decision  ??? Requesting a test or referral  ??? Getting a prescription medication  ??? Some medications require a visit to assess your health on the medication. In this case, make sure your appointment is scheduled ahead of you running out of medication.  ??? Some medications require a care plan from a specialist with expertise in safety and monitoring requirements; we can discuss if this applies to any of your medicines.    2. Bring your things (and your people)  ??? Pill bottles to help [place] review all of your current medications and supplements (especially if changes have been made or we need to answer specific questions about what to take and when)  ??? Blood pressure cuff - for [place] to help you check its accuracy  ??? Your health log(s) - are you tracking blood pressure? Blood sugar? Your diet? Did you take a picture of the rash at its worst?  ??? Your supporter(s) - it can be helpful to have a family member, caregiver, or close friend accompany you to listen, take notes, and offer support.  ??? Your health insurance card for the front desk    3. Come on time  ??? I do my best to see all of my patients on time (though emergencies do happen). It will take about 15 for you to check in once you arrive, so PLEASE ARRIVE 15 minutes BEFORE YOUR VISIT  and plan for challenges of [address] traffic and navigating our [employer].  ??? If you have special needs, such as an interpreter, please let us know.    4. Please make sure your address, email and phone number are up-to-date in our system when you check in. We use these to contact you about important health information, including test results.    5. Bring your files  It is important to bring non-[place] health records with you to your visit for [place] to add to your records here. This helps [place] make medical decisions and avoids duplication of tests. To obtain your records, call the office or hospital where your care was provided and request the copy of records be released directly to you. Keep a copy  for yourself and bring a copy for [place] to scan. If they offer to fax it to my office, the fax number is: [phone]. Important records include:  ??? Immunizations (vaccines)  ??? Medication list (include tablet size in milligrams ''mg'' and how often you take it)  ??? Test results (blood tests, urine tests, x-rays, CT scans, MRIs, ultrasounds, heart tests, colonoscopy, pathology, or other). For radiology with abnormal findings, it is best to bring the radiology report and keep with you a CD with the images (for any later comparisons and specialty clinic visits).  ??? Doctors' notes - especially summaries of testing and treatment  ??? Hospitalization summaries (???Discharge summary???). Note: if you are hospitalized, make certain to schedule a hospital follow-up appointment with your primary care physician after discharge.    We may be able to access your records if they are stored electronically at a cooperating institution. Check this list to see if your previous health organization participates in the record-sharing program:   [email or web address]    If you need your records from [place], contact the Medical Records office at [phone] or see instructions on the website:    [email or web address].      FREQUENTLY ASKED QUESTIONS (FAQs)    ** How do I follow through on the plan from my office visit?    ? Written instructions/advice: Stop in our Check-Out Room inside the clinic. They will print out any written advice from your visit on an ???After Visit Summary.???    ? Laboratory tests: Your labs can be drawn conveniently in our clinic. You may also show up to any [place] laboratory and provide your name and date of birth, and they will be able to find the orders for the lab tests. On request, we can also send orders to outside labs (e.g. [facility] or [facility])    ? Imaging studies: General X-Ray can be done same-day in our building. Advanced imaging and diagnostic studies generally require insurance review and approval prior to scheduling. The Check-Out staff will provide information for scheduling.    ? Referrals: Stop in the Check-Out room; the staff will verify that the referral has been placed. They will inform you if you can schedule your appointment immediately or if you need to wait for insurance authorization. They will also provide information for scheduling.    ? Follow-up: Stop in the Check-Out room and the staff will schedule your follow-up visit. If you prefer to schedule at a later time, you can call our office at [phone] or request an appointment online through [facility].  If you have seen someone other than your primary care provider for an urgent visit, it is okay to schedule your follow-up with either the doctor who saw you for urgent care or your primary care physician.     ? Outside records requests: If your doctor has indicated that outside records should be requested, please sign the form ???Authorization for Release of Health Information??? at the Check-Out before you leave. We cannot request your personal health records from other doctors/hospitals without your written permission.    ** How do I get my test results from the visit?    ? If you sign up for [facility] online ([email or web address]), we will release test results online with comments once your test results are available, usually within 1 week, often same day. Some specialized test results may take several weeks.    OR    ? If you are not signed up for [facility], Our  team will call you.  If we are unable to reach you, we will send you a letter within 1-2 weeks of your visit.        ? If an urgent test is ordered at your visit, such as an X-ray to look for a broken bone, our team will give you a call to make sure that you are aware of the results.  ? Please contact our office if you have not received a result in the expected time frame.      ** Can I ask questions after the visit?    Yes! It is important to let us know if you have any trouble with the treatment plan that we agree upon or if your symptom(s) are not improving as expected. For non-emergency contact, you may reach your doctor two ways:    ? Online: send NON-URGENT medical questions through the [facility] website ([email or web address]). These are returned within 1-2 business days. MESSAGES SENT AFTER 5PM OR OVER THE WEEKEND WILL NOT BE SENT TO THE PROVIDER UNTIL THE NEXT BUSINESS DAY.  ? Call us at [phone] during our business hours.  o Our staff are trained to gather information that will help your doctor???s office respond to your message quickly and correctly. Please give them as much information as possible to help them transmit your call to the appropriate staff in your doctor???s office.  o Calls will usually be returned on the same business day unless the doctor is out of the office, in which case they will be forwarded to the covering doctor within 1 business day.    ** How do I request a medication refill?    ? If you sign up for [facility] online, you can request refills under the ???Messaging??? section titled ???Request Rx Refill.???  OR   ? You can ask your pharmacy to contact our office directly with a refill request.   OR  ? You can call our office yourself with a refill request.     Important guidelines for medication requests:   o New medications generally require an office visit for medical assessment and medication counseling.  o Medication safety monitoring is different for every medication. We will work together to establish a plan of care for the frequency of office visits, laboratory tests, urine screening, EKGs, blood pressure checks, and any other recommended monitoring for your medications.   o Most long-term medications require a visit at least every six months to check up on your health.  o Medications prescribed by a specialist should be filled through the office of the specialist.   o DEA scheduled medications (I.e. controlled medications): require regular office visits for refills.    ** How much will my medical care cost?    The bill(s) for your visit depend on many factors, including the number and seriousness of medical issues addressed, the amount of time the doctor spends working on your medical care, and tests/procedures/treatments rendered. Your out-of-pocket costs depend on your health insurance. It is your responsibility to be sure that your insurance plan covers laboratory tests, radiology procedures, specialist consultations, immunizations, and any other services.   ??? For the ''annual physical'' (review of disease prevention and current health status), please check your insurance coverage for periodic health maintenance exams and let me know if this is the purpose of your visit.  ??? If other significant issues are addressed during your annual preventive physical exam, it is possible you may see another bill for this.  ???  If medication costs are a concern, talk to your pharmacist and to me about lower cost medications that may be effective for you.    ** How do I get a referral to a specialist?    It is usually best to see a primary care physician prior to a specialty physician visit, especially if you are uncertain about the diagnosis or appropriate specialist. We treat many conditions in primary care, and we coordinate care between specialists. If you have PPO insurance, you may be able to self-refer to specialists at [facility]. If you have HMO insurance (and some other insurance types), it will be necessary to see your primary care physician for a referral.    For the [facility] HMO, if your routine referral requires pre-authorization, you will be notified of the decision within 7 to 10 working days. If you have not received written or verbal notification from [facility] or from your physician within 10 working days, please contact Physician Support Services at [phone].    ** How do I get after-hours care?    [email or web address]: We strive to offer same-day appointments in our office Monday-Friday, 8am-5pm. Call during these hours to request an urgent appointment: [phone]    Evenings/weekends: Our colleagues offer access to after-hours care at our walk-in  IMMEDIATE CARE CLINICS.     After hours IMMEDIATE CARE is available at the following:    [facility] IMMEDIATE CARE CENTERS    [email or web address]      [place] [facility]       8 am - 8 pm, Monday to Friday  9 am - 6 pm, Saturday and Sunday  [facility]  [address], [address]  [place], [place] [address]  [phone] [email or web address]    [facility] Immediate Care  8 am - 8 pm, Monday to Friday  9 am - 6 pm, Saturday and Sunday  [facility]  [address], [address]  [place], [place] [address]  [phone]  [email or web address]    [facility] Immediate Care  8 am - 8 pm, Monday to Friday  9 am - 6 pm, Saturday and Sunday  [address]  [place], [place] [address]  [phone]  [email or web address]      [facility]  9 am - 9 pm, Monday to Friday  9 am - 6 pm, Saturday to Sunday  [address]  [place], [place] [address]  [phone]  [email or web address]    [facility] Immediate Care  8 am - 9 pm, Monday to Friday  9 am - 5 pm, Saturday, Sunday and holidays  [address]  [place], [place] [address]  [phone]  [email or web address]    [facility]  8 am - 9 pm, Monday to Friday  9 am - 5 pm, Saturday, Sunday and holidays  [address]  [place], [place] [address]  [phone]  [email or web address]    [facility] [address] Immediate Care  5 pm - 9 pm, Monday to Friday  9 am - 5 pm, Saturday, Sunday and Holidays  [address], [address]  [place], [place] [address]  [phone]    [facility] Immediate Care  5:30 pm - 9:30 pm, Monday-Friday  10 am - 3 pm, Saturday-Sunday  [address], [place], [place]  [address]  [phone]  [email or web address]    [facility] - Pediatrics only  8 am -  7 pm, Monday to Thursday  9 am - 4 pm, Friday  9 am - 3 pm, Saturday  [address] [place], [address]  [place], [place] [address]  [phone]    [facility]  9 am - 6 pm, Saturday and Sunday  [facility]  [address]  [place], [place] [address]  [phone]  [email or web address]    Please confirm availability as these hours are subject to change. Insurance plans may not consider all [place] providers as being ''in network.''     [facility] has additional urgent care sites, see locations at: [email or web address]    [facility]: [place] [facility] offer fast-track service for minor injuries and illnesses. [facility] strives to treat patients who meet qualifying criteria within 90 minutes or less. Please note, however, that visits to the ER generally cost more than those to a doctor???s office or an urgent care center.     [facility]   Emergency Department  [address]  [place], [place] [address]  Hospital Information: [phone]  Emergency Department: [phone]    [facility]   Emergency Department  [address]  [place], [place] [address]  Hospital Information: [phone]  Emergency Department: [phone]    ** How can I give feedback to [facility] on my visit today?    Your experience, health, and safety are my foremost concerns! Please share with me any feedback that would help improve your care. [facility] also routinely conducts anonymous surveys of patient experience; please share your experience on the survey so that we may improve. If at any time you would like to speak with our clinic manager about your experience, you may call the office at [phone] and request to speak with the manager.      I look forward to working towards your health goals together!     Sincerely,    Salli Real, MD  Family Medicine  Clinical Instructor of Medicine, [facility] Department of Medicine  Primary & Specialty Care DTLA @ [facility]   [address]   [place], [place] [address]   Phone: [phone]  Fax: [phone]

## 2020-07-10 ENCOUNTER — Ambulatory Visit: Payer: PRIVATE HEALTH INSURANCE

## 2020-07-10 DIAGNOSIS — N63 Unspecified lump in unspecified breast: Secondary | ICD-10-CM

## 2020-07-10 NOTE — Progress Notes
Outpatient Note  PMD: No primary care provider on file.  07/10/2020    SUBJECTIVE:  Chief Complaint   Patient presents with   ??? Establish Care   ??? Annual Exam     lump on breast       Subjective: 34 year old female presents to establish care and for follow up on breast lump. 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 She was told there was nothing to do for this. She states that she would like re-imaging because it has been 4 years since follow up for this. She denies any new breast pain, new breast lumps since 2017, skin retractions, nipple discharge. She denies any family history of breast cancer.    Last physical exam >1 year ago    No current specialists      Past Medical History:   Diagnosis Date   ??? No pertinent past medical history      Past Surgical History:   Procedure Laterality Date   ??? NO PAST SURGERIES       Family History   Problem Relation Age of Onset   ??? Hypertension Mother 64   ??? Multiple myeloma Father 37   ??? Ovarian cancer Paternal Great-Aunt thru PGM      Social History     Socioeconomic History   ??? Marital status: Unknown     Spouse name: Not on file   ??? Number of children: Not on file   ??? Years of education: Not on file   ??? Highest education level: Not on file   Occupational History   ??? Not on file   Tobacco Use   ??? Smoking status: Never Smoker   ??? Smokeless tobacco: Never Used   Vaping Use   ??? Vaping Use: Never used   Substance and Sexual Activity   ??? Alcohol use: Not Currently   ??? Drug use: Never   ??? Sexual activity: Not Currently     Partners: Male   Other Topics Concern   ??? Do you exercise at least a day, 3 or more days a week? Yes   ??? Types of Exercise? (List in Comments) Not Asked   ??? Do you follow a special diet? Not Asked   ??? Vegan? Not Asked   ??? Vegetarian? Not Asked   ??? Pescatarian? Yes   ??? Lactose Free? Not Asked   ??? Gluten Free? Not Asked   ??? Omnivore? Not Asked   Social History Narrative   ??? Not on file     Social Determinants of Health     Financial Resource Strain: Not on file   Physical Activity: Not on file   Stress: Not on file     Medications: reviewed medication list in the chart  No outpatient medications have been marked as taking for the 07/10/20 encounter (Office Visit) with Leanord Asal, MD.     Allergies: Patient has no known allergies.  Review of Systems - As above    OBJECTIVE:  BP 119/72  ~ Pulse (!) 108  ~ Temp 36 ???C (96.8 ???F) (Tympanic)  ~ Ht 5' 5.5'' (1.664 m)  ~ Wt 134 lb (60.8 kg)  ~ BMI 21.96 kg/m???   Gen: WDWN, NAD  HEENT: NCAT, EOMI  BREAST: no masses noted, no significant tenderness, no palpable axillary nodes, no skin changes; MA Alma present during entire examination    Labs:  No results found for this or any previous  visit.    Imaging:  None    ASSESSMENT and PLAN:      Diagnoses and all orders for this visit:    Breast lump  -     [place] left breast, diagnostic; Future  -     [place] right breast, diagnostic; Future  -     Mammogram, diagnostic, bilat breasts; Future    Likely 2/2 fibroadenoma as diagnosed in 2017 on breast ultrasound  -Repeat imaging today as requested by patient    30 minutes were spent personally by me today on this encounter which include today's pre-visit review of the chart, obtaining appropriate history, performing an evaluation, documentation and discussion of management with details supported within the note for today's visit. The time documented was exclusive of any time spent on the separately billed procedure.      # Follow-up in 1 week for well adult exam    There are no Patient Instructions on file for this visit.    Signed,  Leanord Asal, MD   Clinical Instructor  Department of Medicine   9:41 AM 07/10/2020

## 2020-07-24 ENCOUNTER — Ambulatory Visit: Payer: PRIVATE HEALTH INSURANCE

## 2020-07-24 DIAGNOSIS — N939 Abnormal uterine and vaginal bleeding, unspecified: Secondary | ICD-10-CM

## 2020-07-24 NOTE — Progress Notes
VISIT TYPE:  -gynecology    REASON FOR VISIT:  -gynecologic evaluation    HPI:  34 y.o. female  Irregular period  Started on 07/11/2020 and lasted 3 days (started at right time)  Bleeding since 12/252021    No recent changes in stress, exercise, diet    Last Pap 04/2019    PAST MEDICAL HISTORY:  -no chronic medical problems    PAST SURGICAL HISTORY:    -none    OBSTETRIC HISTORY:  -nulligravid    GYNECOLOGIC HISTORY:  -menarche at 72  -menses q 27 days, lasting 5 days    MEDICATIONS:  -none    ALLERGIES:  -NKDA    SOCIAL HISTORY:  -non-smoker  -medical student    FAMILY HISTORY:  -ovarian cancer: paternal great aunt, late 64s  -multiple myeloma:  father    REVIEW OF SYSTEMS:  Gen: no weight change, fatigue, Skin: no rash, pruritus, lesions, Breast: no lumps, pain, discharge, HEENT: no headaches, visual change, nasal problems, dental issues, neck problems, Cardio: no pain, palp, DOE, edema, Resp: no pain, dyspnea, wheeze, cough, GI: no change in appetite, pain, reflux, nausea, bowel changes, GU: see HPI, Musc: no pain, swelling, loss of range, Neuro-psych: no sensory, motor, muscular, mood changes, Immun: no allergic reactions, hematologic, adenopathy.    PHYSICAL EXAMINATION:  VS: see vitals signs in record  General: well-developed, well-nourished, no distress  Skin: no rash, icterus, or pallor  HEENT: normocephalic  Extremities: no edema  Pelvic examination:  EGBUS: no lesions, no inguinal adenopathy  Vagina: no discharge or lesions  Cervix: within normal limits, no discharge, no lesions; no CMT      Real-time transvaginal pelvic ultrasound was performed and revealed the following findings:    Uterus: retroverted  Uterine length (cm): 5.45  Uterine width (AP, cm):  3.78  Uterine width (TRV, cm):    Endometrial complex: thin, uniform  Endometrial thickness (mm): 2.7    Fibroids: ? adenomyosis, ? 2.03 x2.25 cm adenomyoma    Pelvic fluid: scant    Right adnexa (cm): 1.84 x 1.66  -normal ovary, no cysts  Left adnexa (cm):  -normal ovary, no cysts    IMPRESSION:  ? Adenomyosis  ? adenomyoma      IMPRESSION:  34 y.o. female      PLAN:  Check labs  MRI to evaluate for possible adenomyosis  Anticipate normalization of menses      Desiree Hane, M.D.  Obstetrics & Gynecology

## 2020-08-09 DIAGNOSIS — N939 Abnormal uterine and vaginal bleeding, unspecified: Secondary | ICD-10-CM

## 2020-08-10 ENCOUNTER — Inpatient Hospital Stay: Payer: PRIVATE HEALTH INSURANCE

## 2020-08-10 MED ADMIN — GADOBUTROL 1 MMOL/ML IV SOLN: 7.5 mL | INTRAVENOUS | @ 05:00:00 | Stop: 2020-08-10 | NDC 50419032511

## 2020-08-27 ENCOUNTER — Ambulatory Visit: Payer: PRIVATE HEALTH INSURANCE

## 2020-09-02 ENCOUNTER — Telehealth: Payer: PRIVATE HEALTH INSURANCE

## 2020-09-02 NOTE — Telephone Encounter
Orders Request    What is being requested? (Tests, Labs, Imaging, etc.): TB test    Reason for the request: Pt needs new TB test for school    Where does the patient want to be seen?  [place]     If outside [place], what is the fax number to the facility?      Has the patient seen their doctor for this matter? yes     Last office visit: 07/10/2020    Patient or caller was offered an appointment but declined.    Patient or caller has been notified of the 24-48 hour turnaround time.

## 2020-09-02 NOTE — Telephone Encounter
09/02/2020 3:15 PM  Spoke with patient states needs tb test and school form filled out, per patient has an appointment on 09/05/20.

## 2020-09-05 ENCOUNTER — Ambulatory Visit: Payer: PRIVATE HEALTH INSURANCE

## 2020-09-11 ENCOUNTER — Ambulatory Visit: Payer: PRIVATE HEALTH INSURANCE

## 2020-09-11 DIAGNOSIS — Z111 Encounter for screening for respiratory tuberculosis: Secondary | ICD-10-CM

## 2020-09-11 DIAGNOSIS — Z0000 Encounter for general adult medical examination without abnormal findings: Secondary | ICD-10-CM

## 2020-09-11 LAB — Hgb A1c: HGB A1C - HPLC: 5.7 — ABNORMAL HIGH (ref <5.7)

## 2020-09-11 LAB — Comprehensive Metabolic Panel
CALCIUM: 9.3 mg/dL (ref 8.6–10.4)
CHLORIDE: 105 mmol/L (ref 96–106)

## 2020-09-11 LAB — Lipid Panel: CHOLESTEROL, HDL: 51 mg/dL (ref >50)

## 2020-09-11 NOTE — Progress Notes
Outpatient Note  PMD: No primary care provider on file.  09/11/2020    SUBJECTIVE:  Lindsey Mcdowell is a 35 y.o. female who presents for well adult exam.  Chief Complaint   Patient presents with   ??? 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  Annual Wellness Visit / Physical    Last seen by PMD:   Hospitalizations/Urgent Care: None this year.  No care team member to display    Last physical exam >1 year ago  ???  No current specialists    The medical history below was entered and/or reviewed with the patient.    Allergies: Patient has no known allergies.  Medications: reviewed medication list in the chart  Patient's last menstrual period was 09/08/2020.  OB History   Gravida Para Term Preterm AB Living   0 0 0 0 0 0   SAB IAB Ectopic Multiple Live Births   0 0 0 0 0     Past Medical History:   Diagnosis Date   ??? No pertinent past medical history      Past Surgical History:   Procedure Laterality Date   ??? NO PAST SURGERIES       Immunization History   Administered Date(s) Administered   ??? COVID-19, mRNA, (Pfizer - Purple Cap) 30 mcg/0.3 mL 02/17/2020, 03/09/2020, 08/19/2020   ??? Tdap 07/01/2017   ??? influenza, unspecified formulation 04/28/2020     Health Maintenance   Topic Date Due   ??? Cervical Ca Screening: HPV Testing  04/26/2024   ??? Cervical Ca Screening: PAP Smear  04/26/2024   ??? Tdap/Td Vaccine (2 - Td or Tdap) 07/02/2027   ??? Influenza Vaccine  Completed   ??? COVID-19 Vaccine  Completed   ??? Hepatitis B Screening  Discontinued   ??? Hepatitis C Screening  Discontinued   ??? HIV Screening  Discontinued     Family History   Problem Relation Age of Onset   ??? Hypertension Mother 58   ??? Multiple myeloma Father 45   ??? Ovarian cancer Paternal Great-Aunt thru PGM      Social History     Tobacco Use   Smoking Status Never Smoker   Smokeless Tobacco Never Used     Social History     Substance and Sexual Activity   Alcohol Use Not Currently     Social History Substance and Sexual Activity   Drug Use Never     Most Recent PHQ-2 Verbal Questionnaire:Negative (09/11/20 1008)  If PHQ-2/PHQ-9 Score is blank, then no PHQ-9 screen was performed.     Most Recent PHQ-2 Score:      Most Recent PHQ-9 Score:            PHQ-9 Score   Depression Severity     Proposed Treatment Actions    (reference)      0  -  4 None - minimal  ??? None     5  -  9  Mild  ??? Watchful waiting; repeat PHQ-9 at follow-up    10 - 14  Moderate  ??? Treatment plan, considering counseling, follow-up and/or pharmacotherapy    15 - 19  Moderately Severe  ??? Active treatment with pharmacotherapy and/or psychotherapy    20 - 27  Severe  ??? Immediate initiation of pharmacotherapy and, if severe impairment or poor response to therapy, expedited referral to a mental health specialist for psychotherapy and/or  collaborative management        Social History     Social History Narrative   ??? Not on file     Review of Systems -   A complete 14-system review of system was performed with pertinent positives and negatives included above and in the HPI and otherwise reviewed and found to be negative and/or non-contributory.    OBJECTIVE:  BP 95/63  ~ Pulse 74  ~ Temp 36.8 ???C (98.2 ???F) (Tympanic)  ~ Ht 5' 5.5'' (1.664 m)  ~ Wt 126 lb 12.8 oz (57.5 kg)  ~ LMP 09/08/2020  ~ BMI 20.78 kg/m???   General appearance - Alert, non-toxic, well appearing, and in no distress.    Eyes - Pupils are equally round and reactive to light, extra ocular movements are intact, conjunctiva are not injected.  Ears - External ear normal, canal normal with no debris, tympanic membrane translucent without erythema or deformity.  Neck - Supple without adenopathy.    Respiratory - Clear to auscultation in anterior and posterior fields, no wheezes, rales or rhonchi, symmetric air movement.  Cardiovascular - Regular rate and rhythm, normal S1 and S2, no murmurs, rubs, clicks or gallops.    Abdomen - Bowel sounds present. Soft, nontender, nondistended, no masses or hepatosplenomegaly.    GU: Patient declines breast and pelvic exam.    Back exam - No deformity of spine.   Musculoskeletal - Full range of motion in neck, shoulders, hips and knees.   Extremities - No peripheral edema. Warm and well perfused, no cyanosis. No clubbing of digits.    Skin - Normal texture and turgor, no rashes, no suspicious skin lesions noted.    Neurological - Alert, oriented. Strength and sensation in upper and lower extremities grossly normal. Normal speech, normal gait.    Psychiatric - Appearance: well dressed, well groomed. Behavior: sits comfortably, answers questions, appropriate eye contact. Attitude: Cooperative. Level of Consciousness: awake and alert. Orientation: Deferred. Speech and Language: Normal rate, rhythm and prosody. Mood: euthymic. Affect: euthymic and consistent with mood. Thought Process/Form: Linear and goal-oriented. Thought Content: As per HPI.  Insight and Judgment: Fair and appropriate.   Attention Span: Normal. Memory: No obvious deficits. Intellectual Functioning: Within normal limits.    Labs:  Results for orders placed or performed in visit on 07/24/20   Prolactin   Result Value Ref Range    Prolactin 18.2 3 - 23.1 ng/mL     Comment: Ingestion of high levels of biotin in dietary supplements may lead to falsely decreased results.   CBC & Platelet Count   Result Value Ref Range    White Blood Cell Count 6.20 4.16 - 9.95 x10E3/uL    Red Blood Cell Count 5.07 3.96 - 5.09 x10E6/uL    Hemoglobin 13.5 11.6 - 15.2 g/dL    Hematocrit 09.8 11.9 - 45.2 %    Mean Corpuscular Volume 85.0 79.3 - 98.6 fL    Mean Corpuscular Hemoglobin 26.6 26.4 - 33.4 pg    MCH Concentration 31.3 (L) 31.5 - 35.5 g/dL    Red Cell Distribution Width-SD 41.8 36.9 - 48.3 fL    Red Cell Distribution Width-CV 13.4 11.1 - 15.5 %    Platelet Count, Auto 267 143 - 398 x10E3/uL    Mean Platelet Volume 11.0 9.3 - 13.0 fL    Nucleated RBC%, automated 0.0 No Ref. Range %     Comment: Percent Reference Range Not Reported per accrediting agency    Absolute Nucleated RBC Count 0.00  0.00 - 0.00 x10E3/uL   Estradiol   Result Value Ref Range    Estradiol 57 pg/mL     Comment: ESTRADIOL      Reference Range (female):    Prepubertal:        <12  pg/mL    Post-menopausal:    <21  pg/mL    Follicular phase: 20-100 pg/mL    Mid-cycle:        80-400 pg/mL    Luteal phase:     30-220 pg/mL    Ingestion of high levels of biotin in dietary supplements may lead to falsely increased results.   FSH   Result Value Ref Range    FSH 12.6 See Comment mIU/mL     Comment: [facility] Reference Range:  Adult Female:    Follicular phase:   2-8 mIU/mL    Mid-cycle:         6-23 mIU/mL    Luteal phase:       1-6 mIU/mL    Post-menopausal: 21-106 mIU/mL  Prepubertal Children: 1-6 mIU/mL  Pregnant:              <6 mIU/mL  Ingestion of high levels of biotin in dietary supplements may lead to falsely decreased results.   TSH with reflex FT4, FT3   Result Value Ref Range    TSH 0.90 0.3 - 4.7 mcIU/mL     Comment: TSH is normal, no further Thyroid tests were performed.    If applicable TSH pregnancy reference intervals:   First trimester (10-[redacted] weeks gestation): 0.03- 4.0 mcIU/mL  Second trimester (14-[redacted] weeks gestation): 0.19- 4.0 mcIU/mL     Glomerular Filtration Rate Est (Creatinine)   Result Value Ref Range    Creatinine 0.80 0.60 - 1.30 mg/dL    GFR Estimate for African American >89 See GFR Additional Information mL/min/1.78m2    GFR Estimate for Non-African American >89 See GFR Additional Information mL/min/1.50m2     Imaging:  No results found.    ASSESSMENT and PLAN:  Rainy Rothman is a 35 y.o. female who presents for   Chief Complaint   Patient presents with   ??? 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    The above diagnosis, orders, and follow-up were discussed with the patient. The patient had all questions answered satisfactorily and understands this recommended plan of care.  # Follow-up in 1 year for well adult exam  No future appointments.    There are no Patient Instructions on file for this visit.    Signed,  Leanord Asal, MD   Clinical Instructor  Department of Medicine   10:29 AM 09/11/2020

## 2020-09-13 ENCOUNTER — Institutional Professional Consult (permissible substitution): Payer: PRIVATE HEALTH INSURANCE
